# Patient Record
Sex: Male | Born: 1990
Health system: Southern US, Community
[De-identification: ages and names within clinical notes are randomized; demographics above are authoritative.]

---

## 2019-12-24 ENCOUNTER — Emergency Department (HOSPITAL_COMMUNITY): Payer: Self-pay

## 2019-12-24 ENCOUNTER — Inpatient Hospital Stay (HOSPITAL_COMMUNITY)
Admission: RE | Admit: 2019-12-24 | Discharge: 2019-12-27 | DRG: 208 | Disposition: A | Discharge status: Home / Self Care | Payer: Self-pay | Attending: Internal Medicine | Admitting: Internal Medicine

## 2019-12-24 DIAGNOSIS — J939 Pneumothorax, unspecified: Secondary | ICD-10-CM | POA: Diagnosis present

## 2019-12-24 DIAGNOSIS — Z86718 Personal history of other venous thrombosis and embolism: Secondary | ICD-10-CM

## 2019-12-24 DIAGNOSIS — R4182 Altered mental status, unspecified: Secondary | ICD-10-CM

## 2019-12-24 DIAGNOSIS — Z20822 Contact with and (suspected) exposure to covid-19: Secondary | ICD-10-CM | POA: Diagnosis present

## 2019-12-24 DIAGNOSIS — J9690 Respiratory failure, unspecified, unspecified whether with hypoxia or hypercapnia: Secondary | ICD-10-CM

## 2019-12-24 DIAGNOSIS — F10929 Alcohol use, unspecified with intoxication, unspecified: Secondary | ICD-10-CM

## 2019-12-24 DIAGNOSIS — Z86711 Personal history of pulmonary embolism: Secondary | ICD-10-CM

## 2019-12-24 DIAGNOSIS — Y907 Blood alcohol level of 200-239 mg/100 ml: Secondary | ICD-10-CM | POA: Diagnosis present

## 2019-12-24 DIAGNOSIS — J690 Pneumonitis due to inhalation of food and vomit: Principal | ICD-10-CM | POA: Diagnosis present

## 2019-12-24 DIAGNOSIS — F10129 Alcohol abuse with intoxication, unspecified: Secondary | ICD-10-CM | POA: Diagnosis present

## 2019-12-24 DIAGNOSIS — G9341 Metabolic encephalopathy: Secondary | ICD-10-CM | POA: Diagnosis present

## 2019-12-24 DIAGNOSIS — F10921 Alcohol use, unspecified with intoxication delirium: Secondary | ICD-10-CM

## 2019-12-24 DIAGNOSIS — T17500A Unspecified foreign body in bronchus causing asphyxiation, initial encounter: Secondary | ICD-10-CM

## 2019-12-24 DIAGNOSIS — E781 Pure hyperglyceridemia: Secondary | ICD-10-CM | POA: Diagnosis present

## 2019-12-24 DIAGNOSIS — Z7901 Long term (current) use of anticoagulants: Secondary | ICD-10-CM

## 2019-12-24 DIAGNOSIS — J9601 Acute respiratory failure with hypoxia: Secondary | ICD-10-CM | POA: Diagnosis present

## 2019-12-24 LAB — COMPREHENSIVE METABOLIC PANEL
ALT: 13 U/L (ref 0–44)
AST: 19 U/L (ref 15–41)
Albumin: 3.8 g/dL (ref 3.5–5.0)
Alkaline Phosphatase: 54 U/L (ref 38–126)
Anion gap: 9 (ref 5–15)
BUN: 11 mg/dL (ref 6–20)
CO2: 25 mmol/L (ref 22–32)
Calcium: 8.4 mg/dL — ABNORMAL LOW (ref 8.9–10.3)
Chloride: 108 mmol/L (ref 98–111)
Creatinine, Ser: 0.98 mg/dL (ref 0.61–1.24)
GFR, Estimated: >60 mL/min (ref >60)
Glucose, Bld: 120 mg/dL — ABNORMAL HIGH (ref 70–99)
Potassium: 4 mmol/L (ref 3.5–5.1)
Sodium: 142 mmol/L (ref 135–145)
Total Bilirubin: 0.9 mg/dL (ref 0.3–1.2)
Total Protein: 6.5 g/dL (ref 6.5–8.1)

## 2019-12-24 LAB — CBC WITH DIFFERENTIAL/PLATELET
Abs Immature Granulocytes: 0.11 10*3/uL — ABNORMAL HIGH (ref 0.00–0.07)
Basophils Absolute: 0.1 10*3/uL (ref 0.0–0.1)
Basophils Relative: 1 %
Eosinophils Absolute: 0.1 10*3/uL (ref 0.0–0.5)
Eosinophils Relative: 2 %
HCT: 46.6 % (ref 39.0–52.0)
Hemoglobin: 14.7 g/dL (ref 13.0–17.0)
Immature Granulocytes: 2 %
Lymphocytes Relative: 41 %
Lymphs Abs: 2.7 10*3/uL (ref 0.7–4.0)
MCH: 27.5 pg (ref 26.0–34.0)
MCHC: 31.5 g/dL (ref 30.0–36.0)
MCV: 87.3 fL (ref 80.0–100.0)
Monocytes Absolute: 0.5 10*3/uL (ref 0.1–1.0)
Monocytes Relative: 8 %
Neutro Abs: 3.1 10*3/uL (ref 1.7–7.7)
Neutrophils Relative %: 46 %
Platelets: 247 10*3/uL (ref 150–400)
RBC: 5.34 MIL/uL (ref 4.22–5.81)
RDW: 13.2 % (ref 11.5–15.5)
WBC: 6.7 10*3/uL (ref 4.0–10.5)
nRBC: 0 % (ref 0.0–0.2)

## 2019-12-24 LAB — ETHANOL: Alcohol, Ethyl (B): 204 mg/dL — ABNORMAL HIGH (ref <10)

## 2019-12-24 MED ORDER — ORAL CARE MOUTH RINSE
15.0000 mL | OROMUCOSAL | Status: DC
  Administered 2019-12-25 – 2019-12-27 (×17): 15 mL via OROMUCOSAL

## 2019-12-24 MED ORDER — VITAL HIGH PROTEIN PO LIQD
1000.0000 mL | ORAL | Status: AC
  Administered 2019-12-25: 1000 mL

## 2019-12-24 MED ORDER — FAMOTIDINE IN NACL 20-0.9 MG/50ML-% IV SOLN
20.0000 mg | Freq: Two times a day (BID) | INTRAVENOUS | Status: DC
  Administered 2019-12-25 (×2): 20 mg via INTRAVENOUS
  Filled 2019-12-24 (×2): qty 50

## 2019-12-24 MED ORDER — ONDANSETRON HCL 4 MG/2ML IJ SOLN
4.0000 mg | Freq: Four times a day (QID) | INTRAMUSCULAR | Status: DC | PRN
  Administered 2019-12-26 – 2019-12-27 (×2): 4 mg via INTRAVENOUS
  Filled 2019-12-24 (×2): qty 2

## 2019-12-24 MED ORDER — PROSOURCE TF PO LIQD
45.0000 mL | Freq: Two times a day (BID) | ORAL | Status: DC
  Administered 2019-12-25 (×2): 45 mL
  Filled 2019-12-24 (×2): qty 45

## 2019-12-24 MED ORDER — ROCURONIUM BROMIDE 50 MG/5ML IV SOLN
INTRAVENOUS | Status: AC | PRN
  Administered 2019-12-24: 80 mg via INTRAVENOUS

## 2019-12-24 MED ORDER — PROPOFOL 1000 MG/100ML IV EMUL
0.0000 ug/kg/min | INTRAVENOUS | Status: DC
  Administered 2019-12-24: 35 ug/kg/min via INTRAVENOUS
  Administered 2019-12-25: 30 ug/kg/min via INTRAVENOUS
  Administered 2019-12-25: 50 ug/kg/min via INTRAVENOUS
  Administered 2019-12-25: 40 ug/kg/min via INTRAVENOUS
  Administered 2019-12-25 (×3): 50 ug/kg/min via INTRAVENOUS
  Administered 2019-12-26: 10 ug/kg/min via INTRAVENOUS
  Administered 2019-12-26: 50 ug/kg/min via INTRAVENOUS
  Filled 2019-12-24 (×5): qty 100
  Filled 2019-12-24: qty 200
  Filled 2019-12-24 (×2): qty 100

## 2019-12-24 MED ORDER — SODIUM CHLORIDE 0.9 % IV SOLN
2.0000 g | Freq: Once | INTRAVENOUS | Status: AC
  Administered 2019-12-25: 2 g via INTRAVENOUS
  Filled 2019-12-24: qty 20

## 2019-12-24 MED ORDER — ROCURONIUM BROMIDE 50 MG/5ML IV SOLN
INTRAVENOUS | Status: AC | PRN
  Administered 2019-12-24: 100 mg via INTRAVENOUS

## 2019-12-24 MED ORDER — ENOXAPARIN SODIUM 40 MG/0.4ML ~~LOC~~ SOLN
40.0000 mg | SUBCUTANEOUS | Status: DC
  Administered 2019-12-25: 40 mg via SUBCUTANEOUS
  Filled 2019-12-24: qty 0.4

## 2019-12-24 MED ORDER — DOCUSATE SODIUM 100 MG PO CAPS: 100.0000 mg | ORAL_CAPSULE | Freq: Two times a day (BID) | ORAL | Status: DC | PRN

## 2019-12-24 MED ORDER — NALOXONE HCL 2 MG/2ML IJ SOSY
PREFILLED_SYRINGE | INTRAMUSCULAR | Status: AC
  Filled 2019-12-24: qty 2

## 2019-12-24 MED ORDER — POLYETHYLENE GLYCOL 3350 17 G PO PACK: 17.0000 g | PACK | Freq: Every day | ORAL | Status: DC | PRN

## 2019-12-24 MED ORDER — FENTANYL CITRATE (PF) 100 MCG/2ML IJ SOLN: 50.0000 ug | Freq: Once | INTRAMUSCULAR | Status: DC

## 2019-12-24 MED ORDER — SODIUM CHLORIDE 0.9 % IV SOLN: 2.0000 g | INTRAVENOUS | Status: DC

## 2019-12-24 MED ORDER — PROPOFOL 1000 MG/100ML IV EMUL
INTRAVENOUS | Status: AC
  Administered 2019-12-24: 10 ug/kg/min
  Filled 2019-12-24: qty 100

## 2019-12-24 MED ORDER — POLYETHYLENE GLYCOL 3350 17 G PO PACK
17.0000 g | PACK | Freq: Every day | ORAL | Status: DC
  Administered 2019-12-25: 17 g via ORAL
  Filled 2019-12-24: qty 1

## 2019-12-24 MED ORDER — FENTANYL BOLUS VIA INFUSION
50.0000 ug | INTRAVENOUS | Status: DC | PRN
  Administered 2019-12-25 (×2): 50 ug via INTRAVENOUS
  Filled 2019-12-24: qty 50

## 2019-12-24 MED ORDER — SODIUM CHLORIDE 0.9 % IV SOLN
500.0000 mg | INTRAVENOUS | Status: DC
  Administered 2019-12-25: 500 mg via INTRAVENOUS
  Filled 2019-12-24: qty 500

## 2019-12-24 MED ORDER — DOCUSATE SODIUM 50 MG/5ML PO LIQD
100.0000 mg | Freq: Two times a day (BID) | ORAL | Status: DC
  Administered 2019-12-25: 100 mg via ORAL
  Filled 2019-12-24: qty 10

## 2019-12-24 MED ORDER — ETOMIDATE 2 MG/ML IV SOLN
INTRAVENOUS | Status: AC | PRN
  Administered 2019-12-24: 20 mg via INTRAVENOUS

## 2019-12-24 MED ORDER — MIDAZOLAM HCL 2 MG/2ML IJ SOLN: 2.0000 mg | INTRAMUSCULAR | Status: DC | PRN

## 2019-12-24 MED ORDER — CHLORHEXIDINE GLUCONATE 0.12% ORAL RINSE (MEDLINE KIT)
15.0000 mL | Freq: Two times a day (BID) | OROMUCOSAL | Status: DC
  Administered 2019-12-25 – 2019-12-26 (×4): 15 mL via OROMUCOSAL

## 2019-12-24 MED ORDER — PROPOFOL 10 MG/ML IV BOLUS
INTRAVENOUS | Status: AC | PRN
  Administered 2019-12-24: 100 mg via INTRAVENOUS

## 2019-12-24 MED ORDER — FENTANYL 2500MCG IN NS 250ML (10MCG/ML) PREMIX INFUSION
50.0000 ug/h | INTRAVENOUS | Status: DC
  Administered 2019-12-25: 200 ug/h via INTRAVENOUS
  Administered 2019-12-25: 50 ug/h via INTRAVENOUS
  Filled 2019-12-24 (×2): qty 250

## 2019-12-24 MED ORDER — ALBUTEROL SULFATE (2.5 MG/3ML) 0.083% IN NEBU: 2.5000 mg | INHALATION_SOLUTION | RESPIRATORY_TRACT | Status: DC | PRN

## 2019-12-24 MED ORDER — SODIUM CHLORIDE 0.9 % IV BOLUS
1000.0000 mL | Freq: Once | INTRAVENOUS | Status: AC
  Administered 2019-12-24: 1000 mL via INTRAVENOUS

## 2019-12-24 NOTE — ED Notes (Addendum)
Wo Baza, roommate, [phone]. Call for updates.

## 2019-12-24 NOTE — ED Notes (Signed)
4 mg IV zofran given

## 2019-12-24 NOTE — Progress Notes (Signed)
RT note.  ETT advance to 25 at lips per Dr. Renaye Rakers. RT transported to CT and back without any complications at this time.

## 2019-12-24 NOTE — ED Notes (Addendum)
Pt self extubated at this time. Provider at bedside bagging pt

## 2019-12-24 NOTE — ED Triage Notes (Signed)
Pt BIB [facility], found outside at a party, initially only responsive to pain. +ETOH, unknown drug use. Pt vomited x 1 on arrival.

## 2019-12-24 NOTE — H&P (Signed)
NAME:  Seth Burnett, MRN:  [id number], DOB:  11-16-1990, LOS: 0 ADMISSION DATE:  12/24/2019, CONSULTATION DATE: December 24, 2019 REFERRING MD: Dr. Renaye Rakers, CHIEF COMPLAINT:  Found down  Brief History   29 year old male brought in by EMS after being found down, was reported to have been drinking heavily throughout the day. Intubated in the emergency room.3  History of present illness   29 year old male with no known past medical history was brought in by EMS after being found down in the setting of heavy alcohol use on December 24, 2019. In the emergency room the patient was noted to be profoundly hypoxemic and obtunded requiring intubation. After intubation he self extubated but remained obtunded and profoundly hypoxemic. He was promptly reintubated. Pulmonary and critical care medicine is consulted for further evaluation. There are no family present to give history, the only reported history was that he had been drinking heavily all day. The patient was intubated on my evaluation and cannot provide history.  Past Medical History  Unknown  Significant Hospital Events   October 30 admission  Consults:  None  Procedures:  October 30 endotracheal tube    Significant Diagnostic Tests:  October 30 CT head unremarkable October 30 CT chest images independently reviewed showing debris in the bronchus intermedius, mucus plugging and consolidation in the right lower lobe  Micro Data:  October 30 SARS-CoV-2/flu pending  Antimicrobials:  October 30 ceftriaxone October 30 azithromycin  Interim history/subjective:    Objective   Blood pressure 129/90, pulse 74, temperature (!) 97.4 F (36.3 C), temperature source Temporal, resp. rate 15, height (S) 6\' 3"  (1.905 m), weight 107.8 kg, SpO2 100 %.    Vent Mode: PRVC FiO2 (%):  [100 %] 100 % Set Rate:  [15 bmp] 15 bmp Vt Set:  [500 mL-670 mL] 670 mL PEEP:  [5 cmH20] 5 cmH20   Intake/Output Summary (Last 24 hours) at 12/24/2019  2327 Last data filed at 12/24/2019 2326 Gross per 24 hour  Intake 1020 ml  Output --  Net 1020 ml   Filed Weights   12/24/19 2151  Weight: 107.8 kg    Examination: General: Moaning, nonpurposeful movements, respiratory distress HENT: NCAT ETT in place pupils equal round reactive to light, episcleritis/redness noted PULM: Rhonchi bilaterally B, vent supported breathing CV: RRR, no mgr GI: BS+, soft, nontender MSK: normal bulk and tone Neuro: Nonpurposeful movements, grunts, nonverbal, no eye-opening  Chest x-ray images independently reviewed showing ET tube in place, right lower lobe consolidation  Resolved Hospital Problem list     Assessment & Plan:  Acute respiratory failure with hypoxemia in the setting of inability to protect airway Aspiration pneumonia Admit to ICU Full mechanical ventilatory support Daily spontaneous breathing trial and wake-up assessment Ventilator associated pneumonia prevention protocol Ceftriaxone Azithromycin  Acute metabolic encephalopathy: Presumably due to alcohol intoxication, question whether or not there are other substances involve; he is waking up but remains a risk to himself as he self extubated and became immediately profoundly hypoxemic Urine drug screen Needs sedation to be maintained on full mechanical ventilatory support: PAD protocol Propofol infusion Fentanyl infusion As needed Versed injection RASS goal -2  Best practice:  Diet: Tube feeding Pain/Anxiety/Delirium protocol (if indicated): As above VAP protocol (if indicated): Yes DVT prophylaxis: Lovenox GI prophylaxis: Famotidine Glucose control: Monitor Mobility: Bedrest Code Status: Full Family Communication: None present Disposition:   Labs   CBC: Recent Labs  Lab 12/24/19 2116  WBC 6.7  NEUTROABS 3.1  HGB 14.7  HCT  46.6  MCV 87.3  PLT 247    Basic Metabolic Panel: Recent Labs  Lab 12/24/19 2116  NA 142  K 4.0  CL 108  CO2 25  GLUCOSE 120*   BUN 11  CREATININE 0.98  CALCIUM 8.4*   GFR: Estimated Creatinine Clearance: 146.2 mL/min (by C-G formula based on SCr of 0.98 mg/dL). Recent Labs  Lab 12/24/19 2116  WBC 6.7    Liver Function Tests: Recent Labs  Lab 12/24/19 2116  AST 19  ALT 13  ALKPHOS 54  BILITOT 0.9  PROT 6.5  ALBUMIN 3.8   No results for input(s): LIPASE, AMYLASE in the last 168 hours. No results for input(s): AMMONIA in the last 168 hours.  ABG No results found for: PHART, PCO2ART, PO2ART, HCO3, TCO2, ACIDBASEDEF, O2SAT   Coagulation Profile: No results for input(s): INR, PROTIME in the last 168 hours.  Cardiac Enzymes: No results for input(s): CKTOTAL, CKMB, CKMBINDEX, TROPONINI in the last 168 hours.  HbA1C: No results found for: HGBA1C  CBG: No results for input(s): GLUCAP in the last 168 hours.  Review of Systems:   Cannot obtain due to altered mental status/intubation  Past Medical/Surgical/Family  History  Cannot obtain due to intubation  Allergies Not on File   Home Medications  Prior to Admission medications   Not on File     Critical care time: 33 minutes    Heber [place], MD [facility] Pager: [phone] Cell: [phone] If no response, call [phone]

## 2019-12-24 NOTE — ED Provider Notes (Signed)
[facility] EMERGENCY DEPARTMENT Provider Note   CSN: [id number] Arrival date & time: 12/24/19  2059     History Chief Complaint  Patient presents with  . Altered Mental Status    Seth Burnett is a 29 y.o. male w/ unknown PMHx presenting with AMS.  EMS called to the scene by bystanders as patient found unresponsive in a public setting.  Patient is obtunded on arrival, cannot provide history.  EMS reports patient found "covered in vomit" and vomited once en route to the hospital, and then again on arrival.   No immediate family members available or family contacts available  Supplemental history was later provided by 2 friends in the hospital, who report the patient was with them "drinking all day, shots after shots of liquor."  It was unclear if he took any other drugs, but they "don't think so." They deny any kind of altercation or head trauma that they witnessed.  They are not aware of his full medical history.  I asked them repeatedly to help [place] contact family members, and they said they would try to help look them up, but did not have information immediately available.  HPI     No past medical history on file.  Patient Active Problem List   Diagnosis Date Noted  . Alcohol intoxication ([place]) 12/24/2019    No family history on file.  Social History   Tobacco Use  . Smoking status: Not on file  Substance Use Topics  . Alcohol use: Not on file  . Drug use: Not on file    Home Medications Prior to Admission medications   Not on File    Allergies    Patient has no allergy information on record.  Review of Systems   Review of Systems  Unable to perform ROS: Patient unresponsive (level 5 caveat)    Physical Exam Updated Vital Signs BP 102/70 (BP Location: Right Arm)   Pulse 78   Temp (!) 97.4 F (36.3 C) (Temporal)   Resp 20   Ht (S) _0  (1.905 m)   Wt 107.8 kg   SpO2 95%   BMI 29.70 kg/m   Physical Exam Vitals and nursing  note reviewed.  Constitutional:      Appearance: He is well-developed.     Comments: Obtunded, vomit in mouth and chin  HENT:     Head: Normocephalic and atraumatic.  Eyes:     Pupils: Pupils are equal, round, and reactive to light.     Comments: Sclera injected bilaterally  Cardiovascular:     Rate and Rhythm: Normal rate and regular rhythm.     Pulses: Normal pulses.  Pulmonary:     Effort: Pulmonary effort is normal. No respiratory distress.     Comments: Coarse breath sounds bilaterally, diminished in lung bases 90% on room air Abdominal:     General: There is no distension.     Palpations: Abdomen is soft.     Tenderness: There is no abdominal tenderness.  Musculoskeletal:     Cervical back: Neck supple.  Skin:    General: Skin is warm and dry.  Neurological:     Mental Status: He is unresponsive.     GCS: GCS eye subscore is 1. GCS verbal subscore is 1. GCS motor subscore is 1.     Comments: Gag reflex and blink reflex present     ED Results / Procedures / Treatments   Labs (all labs ordered are listed, but only abnormal results  are displayed) Labs Reviewed  COMPREHENSIVE METABOLIC PANEL - Abnormal; Notable for the following components:      Result Value   Glucose, Bld 120 (*)    Calcium 8.4 (*)    All other components within normal limits  CBC WITH DIFFERENTIAL/PLATELET - Abnormal; Notable for the following components:   Abs Immature Granulocytes 0.11 (*)    All other components within normal limits  ETHANOL - Abnormal; Notable for the following components:   Alcohol, Ethyl (B) 204 (*)    All other components within normal limits  I-STAT ARTERIAL BLOOD GAS, ED - Abnormal; Notable for the following components:   pO2, Arterial 49 (*)    All other components within normal limits  RESPIRATORY PANEL BY RT PCR (FLU A&B, COVID)  CULTURE, BLOOD (ROUTINE X 2)  CULTURE, BLOOD (ROUTINE X 2)  BLOOD GAS, VENOUS  URINALYSIS, ROUTINE W REFLEX MICROSCOPIC  RAPID URINE  DRUG SCREEN, HOSP PERFORMED  BLOOD GAS, ARTERIAL  HIV ANTIBODY (ROUTINE TESTING W REFLEX)  MAGNESIUM  MAGNESIUM  PHOSPHORUS  PHOSPHORUS  CBC  COMPREHENSIVE METABOLIC PANEL  CBC WITH DIFFERENTIAL/PLATELET  URINALYSIS, ROUTINE W REFLEX MICROSCOPIC  CBC  BASIC METABOLIC PANEL  BLOOD GAS, ARTERIAL  TRIGLYCERIDES  MAGNESIUM  PHOSPHORUS    EKG None  Radiology CT Head Wo Contrast  Result Date: 12/24/2019 CLINICAL DATA:  Mental status changes EXAM: CT HEAD WITHOUT CONTRAST TECHNIQUE: Contiguous axial images were obtained from the base of the skull through the vertex without intravenous contrast. COMPARISON:  None. FINDINGS: Brain: No acute intracranial abnormality. Specifically, no hemorrhage, hydrocephalus, mass lesion, acute infarction, or significant intracranial injury. Vascular: No hyperdense vessel or unexpected calcification. Skull: No acute calvarial abnormality. Sinuses/Orbits: Mucosal thickening within the ethmoid air cells. No air-fluid levels. Other: None IMPRESSION: No intracranial abnormality. Electronically Signed   By: Rolm Baptise M.D.   On: 12/24/2019 22:50   CT Chest Wo Contrast  Result Date: 12/24/2019 CLINICAL DATA:  Altered mental status, found down. EXAM: CT CHEST WITHOUT CONTRAST TECHNIQUE: Multidetector CT imaging of the chest was performed following the standard protocol without IV contrast. COMPARISON:  None. FINDINGS: Cardiovascular: No significant vascular findings. Normal heart size. A trace amount of pericardial fluid is seen. Mediastinum/Nodes: There is mild left-sided supraclavicular lymphadenopathy. No enlarged mediastinal or axillary lymph nodes. Thyroid gland, trachea, and esophagus demonstrate no significant findings. Lungs/Pleura: Endotracheal and nasogastric tubes are seen (see below). Moderate severity consolidation is seen within the posterior aspect of the bilateral lower lobes. There is no evidence of a pleural effusion or pneumothorax. Upper Abdomen:  A nasogastric tube seen with its distal portion extending along the anterior and lateral aspects of the left upper quadrant. This is adjacent to the body of the stomach the, however this appears to extend outside of the gastric lumen. The distal tip of the nasogastric tube is seen within the posterior aspect of the left upper quadrant. It should be noted that free air is not clearly identified within the left upper quadrant. Musculoskeletal: No chest wall mass or suspicious bone lesions identified. IMPRESSION: 1. Moderate severity bilateral lower lobe consolidation. 2. Mild left-sided supraclavicular lymphadenopathy. 3. Nasogastric tube positioning as described above. The distal portion of the NG tube may be within the lumen of a decompressed stomach, however, given its appearance on the current exam, extraluminal location cannot be excluded. Further evaluation with an abdominal plain film, following injection of water-soluble radiopaque contrast into the nasogastric tube lumen, is recommended. Electronically Signed   By: Virgina Norfolk  M.D.   On: 12/24/2019 23:12   DG Chest Portable 1 View  Result Date: 12/24/2019 CLINICAL DATA:  Post intubation EXAM: PORTABLE CHEST 1 VIEW COMPARISON:  12/24/2019 FINDINGS: endotracheal tube is 2.3 cm above the carina. Complete opacification of the right hemithorax, new since prior study. Left lung clear. Heart is normal size. NG tube is in the stomach. IMPRESSION: Endotracheal tube 2 cm above the carina. Complete opacification of the right hemithorax Dom presumably volume loss/atelectasis with shift of mediastinal contours to the right. Electronically Signed   By: Rolm Baptise M.D.   On: 12/24/2019 23:42   DG Chest Portable 1 View  Result Date: 12/24/2019 CLINICAL DATA:  Altered mental status, found down. EXAM: PORTABLE CHEST 1 VIEW COMPARISON:  None. FINDINGS: An endotracheal tube is seen with its distal tip approximately 9.9 cm from the carina. A nasogastric tube is  seen with its distal end looped within the body of the stomach. The heart size and mediastinal contours are within normal limits. Both lungs are clear. The visualized skeletal structures are unremarkable. IMPRESSION: 1. Endotracheal tube and nasogastric tube positioning, as described above. 2. No acute or active cardiopulmonary disease. Electronically Signed   By: Virgina Norfolk M.D.   On: 12/24/2019 21:56    Procedures Procedure Name: Intubation Date/Time: 12/25/2019 12:19 AM Performed by: Wyvonnia Dusky, MD Pre-anesthesia Checklist: Patient identified, Patient being monitored, Emergency Drugs available, Timeout performed and Suction available Oxygen Delivery Method: Non-rebreather mask Preoxygenation: Pre-oxygenation with 100% oxygen Induction Type: Rapid sequence Ventilation: Mask ventilation without difficulty Laryngoscope Size: Mac and 4 Tube size: 7.5 mm Number of attempts: 2 Airway Equipment and Method: Bougie stylet Placement Confirmation: ETT inserted through vocal cords under direct vision,  CO2 detector and Breath sounds checked- equal and bilateral Secured at: 23 cm Tube secured with: ETT holder Dental Injury: Bloody posterior oropharynx  Difficulty Due To: Difficult Airway- due to anterior larynx Comments: Difficult airway with anterior larynx and secretions, requiring suctioning.  After initial failed attempt with glidescope, successful intubation with bougie and 7.5 ETT.  No desaturations below 90% during intubation attempts.    .Critical Care Performed by: Wyvonnia Dusky, MD Authorized by: Wyvonnia Dusky, MD   Critical care provider statement:    Critical care time (minutes):  45   Critical care was necessary to treat or prevent imminent or life-threatening deterioration of the following conditions:  Respiratory failure   Critical care was time spent personally by me on the following activities:  Discussions with consultants, evaluation of patient's  response to treatment, examination of patient, ordering and performing treatments and interventions, ordering and review of laboratory studies, ordering and review of radiographic studies, pulse oximetry, re-evaluation of patient's condition, obtaining history from patient or surrogate and review of old charts   (including critical care time)  Medications Ordered in ED Medications  docusate sodium (COLACE) capsule 100 mg (has no administration in time range)  polyethylene glycol (MIRALAX / GLYCOLAX) packet 17 g (has no administration in time range)  docusate (COLACE) 50 MG/5ML liquid 100 mg (has no administration in time range)  polyethylene glycol (MIRALAX / GLYCOLAX) packet 17 g (has no administration in time range)  feeding supplement (VITAL HIGH PROTEIN) liquid 1,000 mL (has no administration in time range)  feeding supplement (PROSource TF) liquid 45 mL (has no administration in time range)  enoxaparin (LOVENOX) injection 40 mg (40 mg Subcutaneous Given 12/25/19 0009)  ondansetron (ZOFRAN) injection 4 mg (has no administration in time range)  famotidine (  PEPCID) IVPB 20 mg premix (has no administration in time range)  albuterol (PROVENTIL) (2.5 MG/3ML) 0.083% nebulizer solution 2.5 mg (has no administration in time range)  chlorhexidine gluconate (MEDLINE KIT) (PERIDEX) 0.12 % solution 15 mL (has no administration in time range)  MEDLINE mouth rinse (has no administration in time range)  fentaNYL (SUBLIMAZE) injection 50 mcg (has no administration in time range)  fentaNYL 252mg in NS 2566m(1046mml) infusion-PREMIX (has no administration in time range)  fentaNYL (SUBLIMAZE) bolus via infusion 50 mcg (has no administration in time range)  propofol (DIPRIVAN) 1000 MG/100ML infusion (35 mcg/kg/min  107.8 kg Intravenous New Bag/Given 12/24/19 2328)  midazolam (VERSED) injection 2 mg (has no administration in time range)  midazolam (VERSED) injection 2 mg (has no administration in time  range)  cefTRIAXone (ROCEPHIN) 2 g in sodium chloride 0.9 % 100 mL IVPB (has no administration in time range)  azithromycin (ZITHROMAX) 500 mg in sodium chloride 0.9 % 250 mL IVPB (has no administration in time range)  cefTRIAXone (ROCEPHIN) 2 g in sodium chloride 0.9 % 100 mL IVPB (2 g Intravenous New Bag/Given 12/25/19 0009)  naloxone (NARCAN) 2 MG/2ML injection (  Given 12/24/19 2113)  sodium chloride 0.9 % bolus 1,000 mL (0 mLs Intravenous Stopped 12/24/19 2326)  etomidate (AMIDATE) injection (20 mg Intravenous Given 12/24/19 2132)  rocuronium (ZEMURON) injection (80 mg Intravenous Given 12/24/19 2132)  etomidate (AMIDATE) injection (20 mg Intravenous Given 12/24/19 2304)  rocuronium (ZEMURON) injection (100 mg Intravenous Given 12/24/19 2305)  propofol (DIPRIVAN) 10 mg/mL bolus/IV push (100 mg Intravenous Given 12/24/19 2310)    ED Course  I have reviewed the triage vital signs and the nursing notes.  Pertinent labs & imaging results that were available during my care of the patient were reviewed by me and considered in my medical decision making (see chart for details).  This is a [age]ar old male with unknown medical history presenting to the ED obtunded by EMS.  Friends on scene reported he had been drinking large amounts of liquor all day.  We do not otherwise have a reliable history, and no family contacts on his arrival.  He was given narcan after arrival with no response.  His BS was wnl.    The decision was made to intubate for airway protection given his recurrent vomiting episodes, concern for aspiration, and his diminished GCS on arrival.  He was successfully intubated with rocuronium and etomidate and a 7.5 ETT.  Propofol was ordered for sedation.  [facility], with no acute ICH, and bilateral pleural consolidations - likely aspiration noted.  See ED course below regarding CT results.   After CT imaging, the patient aroused enough to able to  pull free of his restraints and self-extubated.  He was re-intubated by the ICU team, as per the ED course below.    I personally reviewed his labs and imaging.  Labs showed unremarkable CMP and CBC.  Etoh level 204 - no significant elevation in keeping with his level of obtundation.    EKG showed NSR on arrival, mild diffuse ST elevations consistent with BEL, no reciprocal changes to suggest ACS.  I do not believe this is a STEMI.  I am unable to amend the read or sign the ECG in our system as it is assigned to a [place]     Clinical Course as of Dec 25 31  Sat Dec 24, 2019  2126 No response to  narcan.  Hx from friends is suggestive of significant alcohol poisoning.  GCS remains 3, unresponsive to sternal rub.  Preparing for intubation   [MT]  2157 Intubated with 7.5 ETT    [MT]  2157 ICU consulted   [MT]  2204 FINDINGS: An endotracheal tube is seen with its distal tip approximately 9.9 cm from the carina. A nasogastric tube is seen with its distal end looped within the body of the stomach. The heart size and mediastinal contours are within normal limits. Both lungs are clear. The visualized skeletal structures are unremarkable.     [MT]  2245 ICU consulted.  Etoh level 204 does not likely correlate with the degree of obtundation/intoxication seen here   [MT]  2357 I was called back into the room by nursing staff at approx 11 pm as the patient had self-extubated himself.  On my assessment, he was moving his extremities but not following commands or orders.  I quickly confirmed with glidescope that the ETT tube had been dislodged from his trachea and then I removed the ETT and OG-tube.  We bagged the patient with improvement of his oxygenation level from 60% to 80%.  At this time Dr Pennie Banter from the ICU team presented himself, and the decision was made to re-intubate the patient, as we both did not feel that he was lucid enough for a full trial of extubation at this time, and had  concerning persistent hypoxia.  This re-intubation was performed by Dr Pennie Banter with etomidate and rocuronium with a first-pass trial.  Medical management of the patient was subsequently assumed by the ICU team.   [MT]  Sun Dec 25, 2019  0000 I personally reviewed the patient's imaging.  CTH after initial intubation showed no ICH or clear cause for AMS.  CT chest showed marked bilateral consolidations consistent with significant aspiration PNA, likely from vomiting.  I spoke to the radiologist regarding OG tube placement seen on CT chest. I suspect the OG tube was likely placed in a decompressed stomach, as we did have aspiration of stomach contents from the tube, and I did not see signs of free air under the diaphragm on CT or subsequent xray films to suggest a perforation of the bowel.  Regardless, the original OG tube was removed by myself after the patient had self-extubated.  The ICU team was made aware of the CT chest findings.   [MT]  0009 Repeat xray confirming 2nd ETT placement above the carina and noting now significant collpase or atelectasis of the right lung.  Dr Pennie Banter made aware.  Pt stable on the vent at the time of admission.  No immediate family information available.   [MT]    Clinical Course User Index [MT] Emil Weigold, Carola Rhine, MD    Final Clinical Impression(s) / ED Diagnoses Final diagnoses:  Altered mental status, unspecified altered mental status type  Acute alcoholic intoxication with complication ([place])  Aspiration pneumonia of both lower lobes due to vomit [facility])    Rx / DC Orders ED Discharge Orders    None       Zorian Gunderman, Carola Rhine, MD 12/25/19 [phone]

## 2019-12-25 ENCOUNTER — Inpatient Hospital Stay (HOSPITAL_COMMUNITY): Payer: Self-pay

## 2019-12-25 DIAGNOSIS — J9601 Acute respiratory failure with hypoxia: Secondary | ICD-10-CM

## 2019-12-25 DIAGNOSIS — J690 Pneumonitis due to inhalation of food and vomit: Principal | ICD-10-CM

## 2019-12-25 LAB — MAGNESIUM: Magnesium: 2.3 mg/dL (ref 1.7–2.4)

## 2019-12-25 LAB — CBC WITH DIFFERENTIAL/PLATELET
Abs Immature Granulocytes: 0.06 10*3/uL (ref 0.00–0.07)
Basophils Absolute: 0 10*3/uL (ref 0.0–0.1)
Basophils Relative: 0 %
Eosinophils Absolute: 0 10*3/uL (ref 0.0–0.5)
Eosinophils Relative: 0 %
HCT: 55.4 % — ABNORMAL HIGH (ref 39.0–52.0)
Hemoglobin: 17.2 g/dL — ABNORMAL HIGH (ref 13.0–17.0)
Immature Granulocytes: 1 %
Lymphocytes Relative: 28 %
Lymphs Abs: 2.1 10*3/uL (ref 0.7–4.0)
MCH: 27 pg (ref 26.0–34.0)
MCHC: 31 g/dL (ref 30.0–36.0)
MCV: 87.1 fL (ref 80.0–100.0)
Monocytes Absolute: 0.4 10*3/uL (ref 0.1–1.0)
Monocytes Relative: 6 %
Neutro Abs: 4.9 10*3/uL (ref 1.7–7.7)
Neutrophils Relative %: 65 %
Platelets: 281 10*3/uL (ref 150–400)
RBC: 6.36 MIL/uL — ABNORMAL HIGH (ref 4.22–5.81)
RDW: 13.2 % (ref 11.5–15.5)
WBC: 7.5 10*3/uL (ref 4.0–10.5)
nRBC: 0 % (ref 0.0–0.2)

## 2019-12-25 LAB — I-STAT ARTERIAL BLOOD GAS, ED
Acid-base deficit: 1 mmol/L (ref 0.0–2.0)
Bicarbonate: 25.3 mmol/L (ref 20.0–28.0)
Calcium, Ion: 1.18 mmol/L (ref 1.15–1.40)
HCT: 48 % (ref 39.0–52.0)
Hemoglobin: 16.3 g/dL (ref 13.0–17.0)
O2 Saturation: 84 %
Patient temperature: 97.4
Potassium: 4.7 mmol/L (ref 3.5–5.1)
Sodium: 142 mmol/L (ref 135–145)
TCO2: 27 mmol/L (ref 22–32)
pCO2 arterial: 44.9 mmHg (ref 32.0–48.0)
pH, Arterial: 7.356 (ref 7.350–7.450)
pO2, Arterial: 49 mmHg — ABNORMAL LOW (ref 83.0–108.0)

## 2019-12-25 LAB — POCT I-STAT 7, (LYTES, BLD GAS, ICA,H+H)
Acid-base deficit: 1 mmol/L (ref 0.0–2.0)
Bicarbonate: 24.8 mmol/L (ref 20.0–28.0)
Calcium, Ion: 1.15 mmol/L (ref 1.15–1.40)
HCT: 52 % (ref 39.0–52.0)
Hemoglobin: 17.7 g/dL — ABNORMAL HIGH (ref 13.0–17.0)
O2 Saturation: 100 %
Potassium: 4.3 mmol/L (ref 3.5–5.1)
Sodium: 141 mmol/L (ref 135–145)
TCO2: 26 mmol/L (ref 22–32)
pCO2 arterial: 45.2 mmHg (ref 32.0–48.0)
pH, Arterial: 7.348 — ABNORMAL LOW (ref 7.350–7.450)
pO2, Arterial: 402 mmHg — ABNORMAL HIGH (ref 83.0–108.0)

## 2019-12-25 LAB — URINALYSIS, ROUTINE W REFLEX MICROSCOPIC
Bilirubin Urine: NEGATIVE
Glucose, UA: NEGATIVE mg/dL
Hgb urine dipstick: NEGATIVE
Ketones, ur: NEGATIVE mg/dL
Leukocytes,Ua: NEGATIVE
Nitrite: NEGATIVE
Protein, ur: NEGATIVE mg/dL
Specific Gravity, Urine: 1.017 (ref 1.005–1.030)
pH: 5 (ref 5.0–8.0)

## 2019-12-25 LAB — COMPREHENSIVE METABOLIC PANEL
ALT: 21 U/L (ref 0–44)
AST: 28 U/L (ref 15–41)
Albumin: 4.3 g/dL (ref 3.5–5.0)
Alkaline Phosphatase: 69 U/L (ref 38–126)
Anion gap: 11 (ref 5–15)
BUN: 10 mg/dL (ref 6–20)
CO2: 25 mmol/L (ref 22–32)
Calcium: 8.9 mg/dL (ref 8.9–10.3)
Chloride: 104 mmol/L (ref 98–111)
Creatinine, Ser: 1.17 mg/dL (ref 0.61–1.24)
GFR, Estimated: >60 mL/min (ref >60)
Glucose, Bld: 114 mg/dL — ABNORMAL HIGH (ref 70–99)
Potassium: 5.3 mmol/L — ABNORMAL HIGH (ref 3.5–5.1)
Sodium: 140 mmol/L (ref 135–145)
Total Bilirubin: 1.5 mg/dL — ABNORMAL HIGH (ref 0.3–1.2)
Total Protein: 7.8 g/dL (ref 6.5–8.1)

## 2019-12-25 LAB — RAPID URINE DRUG SCREEN, HOSP PERFORMED
Amphetamines: NOT DETECTED
Barbiturates: NOT DETECTED
Benzodiazepines: NOT DETECTED
Cocaine: NOT DETECTED
Opiates: NOT DETECTED
Tetrahydrocannabinol: POSITIVE — AB

## 2019-12-25 LAB — MRSA PCR SCREENING

## 2019-12-25 LAB — PROTIME-INR
INR: 1.1 (ref 0.8–1.2)
Prothrombin Time: 14 seconds (ref 11.4–15.2)

## 2019-12-25 LAB — HIV ANTIBODY (ROUTINE TESTING W REFLEX): HIV Screen 4th Generation wRfx: NONREACTIVE

## 2019-12-25 LAB — GLUCOSE, CAPILLARY
Glucose-Capillary: 105 mg/dL — ABNORMAL HIGH (ref 70–99)
Glucose-Capillary: 98 mg/dL (ref 70–99)
Glucose-Capillary: 111 mg/dL — ABNORMAL HIGH (ref 70–99)
Glucose-Capillary: 110 mg/dL — ABNORMAL HIGH (ref 70–99)
Glucose-Capillary: 105 mg/dL — ABNORMAL HIGH (ref 70–99)
Glucose-Capillary: 110 mg/dL — ABNORMAL HIGH (ref 70–99)

## 2019-12-25 LAB — APTT: aPTT: 26 seconds (ref 24–36)

## 2019-12-25 LAB — HEPARIN LEVEL (UNFRACTIONATED): Heparin Unfractionated: <0.1 IU/mL — ABNORMAL LOW (ref 0.30–0.70)

## 2019-12-25 LAB — RESPIRATORY PANEL BY RT PCR (FLU A&B, COVID)
Influenza A by PCR: NEGATIVE
Influenza B by PCR: NEGATIVE
SARS Coronavirus 2 by RT PCR: NEGATIVE

## 2019-12-25 LAB — TRIGLYCERIDES: Triglycerides: 143 mg/dL (ref <150)

## 2019-12-25 LAB — PHOSPHORUS: Phosphorus: 2.8 mg/dL (ref 2.5–4.6)

## 2019-12-25 MED ORDER — ENOXAPARIN SODIUM 150 MG/ML ~~LOC~~ SOLN
150.0000 mg | SUBCUTANEOUS | Status: DC
  Administered 2019-12-25 – 2019-12-26 (×2): 150 mg via SUBCUTANEOUS
  Filled 2019-12-25 (×2): qty 1

## 2019-12-25 MED ORDER — CHLORHEXIDINE GLUCONATE 0.12% ORAL RINSE (MEDLINE KIT): 15.0000 mL | Freq: Two times a day (BID) | OROMUCOSAL | Status: DC

## 2019-12-25 MED ORDER — FAMOTIDINE 40 MG/5ML PO SUSR
20.0000 mg | Freq: Two times a day (BID) | ORAL | Status: DC
  Administered 2019-12-25: 20 mg
  Filled 2019-12-25: qty 2.5

## 2019-12-25 MED ORDER — ORAL CARE MOUTH RINSE: 15.0000 mL | OROMUCOSAL | Status: DC

## 2019-12-25 MED ORDER — SODIUM CHLORIDE 3 % IN NEBU
4.0000 mL | INHALATION_SOLUTION | Freq: Two times a day (BID) | RESPIRATORY_TRACT | Status: DC
  Administered 2019-12-25 (×2): 4 mL via RESPIRATORY_TRACT
  Filled 2019-12-25 (×2): qty 4

## 2019-12-25 MED ORDER — VITAL AF 1.2 CAL PO LIQD
1000.0000 mL | ORAL | Status: DC
  Administered 2019-12-25 – 2019-12-26 (×2): 1000 mL

## 2019-12-25 MED ORDER — SODIUM CHLORIDE 0.9 % IV SOLN
3.0000 g | Freq: Four times a day (QID) | INTRAVENOUS | Status: DC
  Administered 2019-12-25 – 2019-12-26 (×5): 3 g via INTRAVENOUS
  Filled 2019-12-25 (×5): qty 8

## 2019-12-25 MED ORDER — SODIUM CHLORIDE 0.9 % IV SOLN
INTRAVENOUS | Status: DC | PRN
  Administered 2019-12-25: 250 mL via INTRAVENOUS

## 2019-12-25 MED ORDER — CHLORHEXIDINE GLUCONATE CLOTH 2 % EX PADS
6.0000 | MEDICATED_PAD | Freq: Every day | CUTANEOUS | Status: DC
  Administered 2019-12-26 (×2): 6 via TOPICAL

## 2019-12-25 NOTE — Progress Notes (Signed)
NAME:  Samson Ralph, MRN:  [id number], DOB:  Apr 17, 1990, LOS: 1 ADMISSION DATE:  12/24/2019, CONSULTATION DATE: December 24, 2019 REFERRING MD: Dr. Renaye Rakers, CHIEF COMPLAINT:  Found down  Brief History   29 year old male brought in by EMS after being found down, was reported to have been drinking heavily throughout the day. Intubated in the emergency room.3  History of present illness   29 year old male with no known past medical history was brought in by EMS after being found down in the setting of heavy alcohol use on December 24, 2019. In the emergency room the patient was noted to be profoundly hypoxemic and obtunded requiring intubation. After intubation he self extubated but remained obtunded and profoundly hypoxemic. He was promptly reintubated. Pulmonary and critical care medicine is consulted for further evaluation. There are no family present to give history, the only reported history was that he had been drinking heavily all day. The patient was intubated on my evaluation and cannot provide history.  Past Medical History  Unknown  Significant Hospital Events   10/30 Admit  Consults:    Procedures:  ETT 10/30 >>   Significant Diagnostic Tests:   CT head 10/30 >> unremarkable  CT 10/30 >>  debris in the bronchus intermedius, mucus plugging and consolidation in the right lower lobe  Micro Data:  COVID 10/30 >> negative Blood 10/31 >>   Antimicrobials:  Rocephin 10/30 >> 10/31 Zithromax 10/30 >> 10/31 Unasyn 10/31 >>  Interim history/subjective:  Remains on sedation, vent.  Objective   Blood pressure 119/77, pulse 88, temperature 99.8 F (37.7 C), temperature source Oral, resp. rate 18, height 6\' 3"  (1.905 m), weight 99.7 kg, SpO2 100 %.    Vent Mode: PRVC FiO2 (%):  [40 %-100 %] 40 % Set Rate:  [15 bmp-18 bmp] 18 bmp Vt Set:  [500 mL-670 mL] 670 mL PEEP:  [5 cmH20-12 cmH20] 10 cmH20   Intake/Output Summary (Last 24 hours) at 12/25/2019 1013 Last data  filed at 12/25/2019 12/27/2019 Gross per 24 hour  Intake 1593.91 ml  Output 865 ml  Net 728.91 ml   Filed Weights   12/24/19 2151 12/25/19 0152  Weight: 107.8 kg 99.7 kg    Examination:  General - sedated Eyes - pupils reactive ENT - ETT in place Cardiac - regular rate/rhythm, no murmur Chest - b/l rales Rt > Lt Abdomen - soft, non tender, + bowel sounds Extremities - no cyanosis, clubbing, or edema Skin - no rashes Neuro - RASS -1, moves all extremities  Resolved Hospital Problem list   Mucus plugging of airway  Assessment & Plan:   Acute hypoxic respiratory failure 2nd to aspiration pneumonia and compromised airway. - full vent support - goal SpO2 > 92% - change ABx to unasyn - f/u CXR  Acute metabolic encephalopathy 2nd to hypoxia, pneumonia, and alcohol intoxication. - RASS goal -1 to -2  Best practice:  Diet: Tube feeding DVT prophylaxis: Lovenox GI prophylaxis: Famotidine Mobility: Bedrest Code Status: Full Disposition: ICU  Labs    CMP Latest Ref Rng & Units 12/25/2019 12/25/2019 12/25/2019  Glucose 70 - 99 mg/dL - 12/27/2019) -  BUN 6 - 20 mg/dL - 10 -  Creatinine 341(P - 1.24 mg/dL - 3.79 -  Sodium 0.24 - 145 mmol/L 141 140 142  Potassium 3.5 - 5.1 mmol/L 4.3 5.3(H) 4.7  Chloride 98 - 111 mmol/L - 104 -  CO2 22 - 32 mmol/L - 25 -  Calcium 8.9 - 10.3 mg/dL - 8.9 -  Total Protein 6.5 - 8.1 g/dL - 7.8 -  Total Bilirubin 0.3 - 1.2 mg/dL - 1.5(H) -  Alkaline Phos 38 - 126 U/L - 69 -  AST 15 - 41 U/L - 28 -  ALT 0 - 44 U/L - 21 -    CBC Latest Ref Rng & Units 12/25/2019 12/25/2019 12/25/2019  WBC 4.0 - 10.5 K/uL - 7.5 -  Hemoglobin 13.0 - 17.0 g/dL 17.7(H) 17.2(H) 16.3  Hematocrit 39 - 52 % 52.0 55.4(H) 48.0  Platelets 150 - 400 K/uL - 281 -   ABG    Component Value Date/Time   PHART 7.348 (L) 12/25/2019 0422   PCO2ART 45.2 12/25/2019 0422   PO2ART 402 (H) 12/25/2019 0422   HCO3 24.8 12/25/2019 0422   TCO2 26 12/25/2019 0422   ACIDBASEDEF 1.0  12/25/2019 0422   O2SAT 100.0 12/25/2019 0422    CBG (last 3)  Recent Labs    12/25/19 0156 12/25/19 0438 12/25/19 0817  GLUCAP 105* 98 111*    Critical care time: 34 minutes  Coralyn Helling, MD [place] [facility] Pager - [phone] 12/25/2019, 10:18 AM

## 2019-12-25 NOTE — Progress Notes (Signed)
[facility] PCCM  Read from the CT chest questions whether or not the OG tube is extraluminal.  OG has been removed. Repeat KUB, hold replacing OG for now  CXR shows R lung white out: most likely due to mucus plugging, will provide pulmonary toilette and consider bronchoscopy after arriving in the ICU  Heber Seminole, MD [place] [facility] Pager: [phone] Cell: [phone] If no response, call [phone]

## 2019-12-25 NOTE — Progress Notes (Addendum)
eLink Physician-Brief Progress Note Patient Name: Seth Burnett DOB: 15-Dec-1990 MRN: [id number]   Date of Service  12/25/2019  HPI/Events of Note  60 M brought in after being found down after heavy alcohol intake. Intubated in the ED as he was obtunded and hypoxic. He self extubated but was immediately re-intubated.  CXR as seen earlier with complete opacification of right hemithorax  On PRVC 15/670/100%/12 PEEP Peak pressure 30, plateau pressure 25  O2 sat 84%  eICU Interventions  Likely aspiration/mucus plug. Already on ceftriaxone and azithromycin Suction and mini lavage c/o RT done without significant secretions retrieved About to be given albuterol nebulization If above interventions fail, bedside CCM team to be informed for possible bronch lavage   O2 sat now improved to 94%   Intervention Category Major Interventions: Respiratory failure - evaluation and management Evaluation Type: New Patient Evaluation  Darl Pikes 12/25/2019, 1:39 AM

## 2019-12-25 NOTE — Progress Notes (Addendum)
Patient's mother, Morton Amy, was updated on patient's status and current plan of care. All questions were answered at this time.   Patient's mother mentioned that he is currently taking blood thinners for PE's. Dr. Arlester Marker (pharmacist) was made aware of this new information.  Patient sees Dr. Thornell Mule at [address] [facility] as his PCP. Phone number: [phone]   Unfortunately, roommate is currently out of town and unable to tell RN which pharmacy the patient uses.

## 2019-12-25 NOTE — Progress Notes (Addendum)
Pharmacy Antibiotic Note  Seth Burnett is a 29 y.o. male admitted on 12/24/2019 with pneumonia.  Pharmacy has been consulted for Unasyn dosing.  Pt without PMH who was admitted after heavy drinking. Ceftriaxone/azith were started but abx will be changed to Unasyn.   Scr 1.17 WBC 7.5  Plan: Unasyn 3g IV q6  Height: 6\' 3"  (190.5 cm) Weight: 99.7 kg (219 lb 12.8 oz) IBW/kg (Calculated) : 84.5  Temp (24hrs), Avg:98.7 F (37.1 C), Min:97.4 F (36.3 C), Max:100 F (37.8 C)  Recent Labs  Lab 12/24/19 2116 12/25/19 0340  WBC 6.7 7.5  CREATININE 0.98 1.17    Estimated Creatinine Clearance: 110.3 mL/min (by C-G formula based on SCr of 1.17 mg/dL).    No Known Allergies  Antimicrobials this admission: 10/31 ceftriaxone x1 10/31 azith x1 10/31 Unasyn>>  Dose adjustments this admission:   Microbiology results: 10/31 blood>> MRSA>>poor sample HIV neg  11/31, PharmD, BCIDP, AAHIVP, CPP Infectious Disease Pharmacist 12/25/2019 11:48 AM   Addendum:  Pt may be on anticoagulant for PE per mom. We will bridge with Lovenox for now until more info are found. We will check INR today.   Lovenox 150mg  SQ q24 F/u with medrec Check INR/HL  12/27/2019, PharmD, BCIDP, AAHIVP, CPP Infectious Disease Pharmacist 12/25/2019 1:34 PM

## 2019-12-25 NOTE — Progress Notes (Signed)
eLink Physician-Brief Progress Note Patient Name: Seth Burnett DOB: 02-28-1990 MRN: [id number]   Date of Service  12/25/2019  HPI/Events of Note  Notified of pneumothorax while in ED. On review of CXR complete opacity of right hemithorax with shift of trachea to the right. ETT appears to be in proper position. Chest CT done earlier with evidence of mucus plugging. No issues with ventilating as per report.  eICU Interventions  Recommend conservative measures, nebulization, suction and chest physiotherapy. Refer back to eLink if with issues ventilating and will inform bedside team as may need bronch lavage. Will reassess by video cam once patient transferred to unit     Intervention Category Major Interventions: Respiratory failure - evaluation and management  Darl Pikes 12/25/2019, 12:17 AM

## 2019-12-25 NOTE — Progress Notes (Signed)
Initial Nutrition Assessment  RD working remotely.  DOCUMENTATION CODES:   Not applicable  INTERVENTION:   -D/c Vital High Protein -Initiate Vital AF 1.2 @ 60 ml/hr via OGT (1440 ml/day)  45 ml Prosource TF BID.    Tube feeding regimen provides 1808 kcal (74% of needs), 130 grams of protein, and 1168 ml of H2O.   TF + propofol to provide 2496 kcals (100% of needs)  NUTRITION DIAGNOSIS:   Inadequate oral intake related to inability to eat as evidenced by NPO status.  GOAL:   Patient will meet greater than or equal to 90% of their needs  MONITOR:   Vent status, Weight trends, Labs, TF tolerance, Skin, I & O's  REASON FOR ASSESSMENT:   Consult Enteral/tube feeding initiation and management  ASSESSMENT:   28 year old male brought in by EMS after being found down, was reported to have been drinking heavily throughout the day. Intubated in the emergency room  Pt admitted with acute respiratory failure, aspiration pneumonia, and acute metabolic encephalopathy likely related to alcohol intoxication.   Patient is currently intubated on ventilator support. OGT in place. MV: 11.8 L/min Temp (24hrs), Avg:98.7 F (37.1 C), Min:97.4 F (36.3 C), Max:100 F (37.8 C)  Propofol: 25.9 ml/hr (684 kcals daily)  Reviewed I/O's: +834 ml x 24 hours  UOP: 675 ml x 24 hours  TF currently infusing via OGT: Vital High Protein @ 40 ml/hr and 45 ml Prosource TF BID. Regimen provides 1040 kcals, 106 grams protein, and 803 ml free water, meeting 43% of estimated kcal needs and 88% of estimated protein needs.   Medications reviewed and include miralax.   Labs reviewed: CBGS: 98-111.   Diet Order:   Diet Order            Diet NPO time specified  Diet effective now                 EDUCATION NEEDS:   No education needs have been identified at this time  Skin:  Skin Assessment: Reviewed RN Assessment  Last BM:  Unknown  Height:   Ht Readings from Last 1 Encounters:   12/25/19 6\' 3"  (1.905 m)    Weight:   Wt Readings from Last 1 Encounters:  12/25/19 99.7 kg    Ideal Body Weight:  89.1 kg  BMI:  Body mass index is 27.47 kg/m.  Estimated Nutritional Needs:   Kcal:  2425  Protein:  120-140 grams  Fluid:  > 2 L    2426, RD, LDN, CDCES Registered Dietitian II Certified Diabetes Care and Education Specialist Please refer to [facility] for RD and/or RD on-call/weekend/after hours pager

## 2019-12-26 ENCOUNTER — Inpatient Hospital Stay (HOSPITAL_COMMUNITY): Payer: Self-pay

## 2019-12-26 DIAGNOSIS — G9341 Metabolic encephalopathy: Secondary | ICD-10-CM

## 2019-12-26 DIAGNOSIS — J690 Pneumonitis due to inhalation of food and vomit: Secondary | ICD-10-CM

## 2019-12-26 DIAGNOSIS — R4182 Altered mental status, unspecified: Secondary | ICD-10-CM

## 2019-12-26 DIAGNOSIS — F10929 Alcohol use, unspecified with intoxication, unspecified: Secondary | ICD-10-CM

## 2019-12-26 LAB — CBC
HCT: 43.4 % (ref 39.0–52.0)
Hemoglobin: 14.1 g/dL (ref 13.0–17.0)
MCH: 27.4 pg (ref 26.0–34.0)
MCHC: 32.5 g/dL (ref 30.0–36.0)
MCV: 84.3 fL (ref 80.0–100.0)
Platelets: 245 10*3/uL (ref 150–400)
RBC: 5.15 MIL/uL (ref 4.22–5.81)
RDW: 13.3 % (ref 11.5–15.5)
WBC: 11.8 10*3/uL — ABNORMAL HIGH (ref 4.0–10.5)
nRBC: 0 % (ref 0.0–0.2)

## 2019-12-26 LAB — BASIC METABOLIC PANEL
Anion gap: 11 (ref 5–15)
BUN: 15 mg/dL (ref 6–20)
CO2: 23 mmol/L (ref 22–32)
Calcium: 8.8 mg/dL — ABNORMAL LOW (ref 8.9–10.3)
Chloride: 105 mmol/L (ref 98–111)
Creatinine, Ser: 1.19 mg/dL (ref 0.61–1.24)
GFR, Estimated: >60 mL/min (ref >60)
Glucose, Bld: 103 mg/dL — ABNORMAL HIGH (ref 70–99)
Potassium: 3.6 mmol/L (ref 3.5–5.1)
Sodium: 139 mmol/L (ref 135–145)

## 2019-12-26 LAB — GLUCOSE, CAPILLARY
Glucose-Capillary: 102 mg/dL — ABNORMAL HIGH (ref 70–99)
Glucose-Capillary: 109 mg/dL — ABNORMAL HIGH (ref 70–99)
Glucose-Capillary: 110 mg/dL — ABNORMAL HIGH (ref 70–99)
Glucose-Capillary: 114 mg/dL — ABNORMAL HIGH (ref 70–99)
Glucose-Capillary: 71 mg/dL (ref 70–99)
Glucose-Capillary: 87 mg/dL (ref 70–99)
Glucose-Capillary: 99 mg/dL (ref 70–99)

## 2019-12-26 LAB — MAGNESIUM: Magnesium: 2 mg/dL (ref 1.7–2.4)

## 2019-12-26 LAB — PHOSPHORUS: Phosphorus: 2 mg/dL — ABNORMAL LOW (ref 2.5–4.6)

## 2019-12-26 LAB — TRIGLYCERIDES: Triglycerides: 166 mg/dL — ABNORMAL HIGH (ref <150)

## 2019-12-26 MED ORDER — APIXABAN 5 MG PO TABS
5.0000 mg | ORAL_TABLET | Freq: Two times a day (BID) | ORAL | Status: DC
  Administered 2019-12-27 (×2): 5 mg via ORAL
  Filled 2019-12-26 (×2): qty 1

## 2019-12-26 MED ORDER — AMOXICILLIN-POT CLAVULANATE 875-125 MG PO TABS
1.0000 | ORAL_TABLET | Freq: Two times a day (BID) | ORAL | Status: DC
  Administered 2019-12-26 – 2019-12-27 (×2): 1 via ORAL
  Filled 2019-12-26 (×2): qty 1

## 2019-12-26 NOTE — Progress Notes (Signed)
Transitions of Care Pharmacist Note  Seth Burnett is a 30 y.o. male that has been diagnosed with previous unprovoked PE and will be continued on Eliquis (apixaban) at discharge.   Patient Education: I provided the following education on Eliquis to the patient: How to take the medication Described what the medication is Signs of bleeding Signs/symptoms of VTE and stroke  Answered their questions  Discharge Medications Plan: The patient wants to have their discharge medications filled by the Transitions of Care pharmacy rather than their usual pharmacy.  The discharge orders pharmacy has been changed to the Transitions of Care pharmacy, the patient will receive a phone call regarding co-pay, and their medications will be delivered by the Transitions of Care pharmacy.    Thank you,   Lamar Sprinkles, PharmD PGY1 Pharmacy Resident 12/26/2019 5:42 PM  December 26, 2019

## 2019-12-26 NOTE — Procedures (Signed)
Extubation Procedure Note  Patient Details:   Name: Claire Bridge DOB: 20-Sep-1990 MRN: [id number]   Airway Documentation:    Vent end date: 12/26/19 Vent end time: 0918   Evaluation  O2 sats: stable throughout Complications: No apparent complications Patient did tolerate procedure well. Bilateral Breath Sounds: Clear   Yes   Positive cuff leak noted. Patient placed on [place] 4 L with humidity, no stridor noted, patient able to reach 2500 mL using the incentive spirometer.  Rayburn Felt 12/26/2019, 9:26 AM

## 2019-12-26 NOTE — Progress Notes (Addendum)
NAME:  Seth Burnett, MRN:  [id number], DOB:  Feb 23, 1991, LOS: 2 ADMISSION DATE:  12/24/2019, CONSULTATION DATE: December 24, 2019 REFERRING MD: Dr. Renaye Rakers, CHIEF COMPLAINT:  Found down  Brief History   29 year old male brought in by EMS after being found down, was reported to have been drinking heavily throughout the day. Intubated in the emergency room.3  History of present illness   29 year old male with no known past medical history was brought in by EMS after being found down in the setting of heavy alcohol use on December 24, 2019. In the emergency room the patient was noted to be profoundly hypoxemic and obtunded requiring intubation. After intubation he self extubated but remained obtunded and profoundly hypoxemic. He was promptly reintubated. Pulmonary and critical care medicine is consulted for further evaluation. There are no family present to give history, the only reported history was that he had been drinking heavily all day. The patient was intubated on my evaluation and cannot provide history.  Past Medical History  Unknown  Significant Hospital Events   10/30 Admit  Consults:    Procedures:  ETT 10/30 >>   Significant Diagnostic Tests:   CT head 10/30 >> unremarkable  CT 10/30 >>  debris in the bronchus intermedius, mucus plugging and consolidation in the right lower lobe  Micro Data:  COVID 10/30 >> negative Blood 10/31 >>   Antimicrobials:  Rocephin 10/30 >> 10/31 Zithromax 10/30 >> 10/31 Unasyn 10/31 >>  Interim history/subjective:  More awake today. Tolerating SBT well.   Objective   Blood pressure 109/68, pulse 74, temperature 98.2 F (36.8 C), temperature source Axillary, resp. rate 18, height 6\' 3"  (1.905 m), weight 100.8 kg, SpO2 100 %.    Vent Mode: PRVC FiO2 (%):  [40 %] 40 % Set Rate:  [18 bmp] 18 bmp Vt Set:  [670 mL] 670 mL PEEP:  [8 cmH20] 8 cmH20 Plateau Pressure:  [19 cmH20] 19 cmH20   Intake/Output Summary (Last 24 hours)  at 12/26/2019 0903 Last data filed at 12/26/2019 0600 Gross per 24 hour  Intake 2819.32 ml  Output 1070 ml  Net 1749.32 ml   Filed Weights   12/24/19 2151 12/25/19 0152 12/26/19 0530  Weight: 107.8 kg 99.7 kg 100.8 kg    Examination: General:  Young adult male of normal body habitus in NAD on SBT Neuro:  Alert, oriented, non-focal. Following commands.  HEENT:  [place]/AT, No JVD noted, PERRL Cardiovascular:  RRR, no MRG Lungs:  Clear bilateral breath sounds. Pulling volumes over 1L on SBT 5/5.  Abdomen:  Soft, non-distended, non-tender.  Musculoskeletal:  No acute deformity or ROM limitation.  Skin:  Intact, MMM  Resolved Hospital Problem list   Mucus plugging of airway  Assessment & Plan:   Acute hypoxic respiratory failure secondary to aspiration pneumonia and compromised airway in the setting of ETOH intoxication. CXR improved.  - extubate - Continue unasyn for 5 day course > 11/3. - supplemental O2 PRN  Acute metabolic encephalopathy 2nd to hypoxia, pneumonia, and alcohol intoxication. - sedatives off for extubation - RASS goal 0  PE history: on home eliquis - restart home Eliquis for this evening, I expect him to be taking PO without difficulty by then.  - Transition off therapuetic dose lovenox.  Hypertriglyceridemia mild 166 - DC propofol  If he tolerates extubation we can plan to move out of ICU this evening and possibly discharge as soon as tomorrow.   Best practice:  Diet: Tube feeding DVT prophylaxis: Lovenox GI  prophylaxis: Famotidine Mobility: Bedrest Code Status: Full Disposition: ICU  Labs    CMP Latest Ref Rng & Units 12/26/2019 12/25/2019 12/25/2019  Glucose 70 - 99 mg/dL 629(B) - 284(X)  BUN 6 - 20 mg/dL 15 - 10  Creatinine 3.24 - 1.24 mg/dL 4.01 - 0.27  Sodium 253 - 145 mmol/L 139 141 140  Potassium 3.5 - 5.1 mmol/L 3.6 4.3 5.3(H)  Chloride 98 - 111 mmol/L 105 - 104  CO2 22 - 32 mmol/L 23 - 25  Calcium 8.9 - 10.3 mg/dL 6.6(Y) - 8.9  Total  Protein 6.5 - 8.1 g/dL - - 7.8  Total Bilirubin 0.3 - 1.2 mg/dL - - 1.5(H)  Alkaline Phos 38 - 126 U/L - - 69  AST 15 - 41 U/L - - 28  ALT 0 - 44 U/L - - 21    CBC Latest Ref Rng & Units 12/26/2019 12/25/2019 12/25/2019  WBC 4.0 - 10.5 K/uL 11.8(H) - 7.5  Hemoglobin 13.0 - 17.0 g/dL 40.3 17.7(H) 17.2(H)  Hematocrit 39 - 52 % 43.4 52.0 55.4(H)  Platelets 150 - 400 K/uL 245 - 281   ABG    Component Value Date/Time   PHART 7.348 (L) 12/25/2019 0422   PCO2ART 45.2 12/25/2019 0422   PO2ART 402 (H) 12/25/2019 0422   HCO3 24.8 12/25/2019 0422   TCO2 26 12/25/2019 0422   ACIDBASEDEF 1.0 12/25/2019 0422   O2SAT 100.0 12/25/2019 0422    CBG (last 3)  Recent Labs    12/26/19 0018 12/26/19 0419 12/26/19 0816  GLUCAP 110* 102* 71    Critical care time: 32 minutes    Joneen Roach, AGACNP-BC [place] [facility]  See Amion for personal pager PCCM on call pager [phone]  12/26/2019 9:17 AM

## 2019-12-26 NOTE — Discharge Instructions (Signed)

## 2019-12-27 ENCOUNTER — Other Ambulatory Visit (HOSPITAL_COMMUNITY): Payer: Self-pay | Admitting: Internal Medicine

## 2019-12-27 DIAGNOSIS — R410 Disorientation, unspecified: Secondary | ICD-10-CM

## 2019-12-27 LAB — GLUCOSE, CAPILLARY
Glucose-Capillary: 87 mg/dL (ref 70–99)
Glucose-Capillary: 90 mg/dL (ref 70–99)

## 2019-12-27 MED ORDER — ONDANSETRON 4 MG PO TBDP: 4.0000 mg | ORAL_TABLET | Freq: Three times a day (TID) | ORAL | 0 refills | Status: DC | PRN

## 2019-12-27 MED ORDER — AMOXICILLIN-POT CLAVULANATE 875-125 MG PO TABS: 1.0000 | ORAL_TABLET | Freq: Two times a day (BID) | ORAL | 0 refills | Status: DC

## 2019-12-27 MED ORDER — ACETAMINOPHEN 325 MG PO TABS
650.0000 mg | ORAL_TABLET | Freq: Once | ORAL | Status: AC
  Administered 2019-12-27: 650 mg via ORAL
  Filled 2019-12-27: qty 2

## 2019-12-27 MED ORDER — APIXABAN 5 MG PO TABS
5.0000 mg | ORAL_TABLET | Freq: Two times a day (BID) | ORAL | 0 refills | Status: DC
Start: 2019-12-27 — End: 2019-12-27

## 2019-12-27 MED FILL — AMOX-CLAV 875-125 MG TABLET: 875-125 | 2 days supply | Qty: 5 | Fill #0

## 2019-12-27 MED FILL — ONDANSETRON ODT 4 MG TABLET: 4 | 3 days supply | Qty: 10 | Fill #0

## 2019-12-27 MED FILL — ELIQUIS 5 MG TABLET: 5 | 30 days supply | Qty: 60 | Fill #0

## 2019-12-27 NOTE — Discharge Summary (Signed)
Physician Discharge Summary       Patient ID: Seth Burnett MRN: [id number] DOB/AGE: 1990/05/03 29 y.o.  Admit date: 12/24/2019 Discharge date: 12/27/2019  Discharge Diagnoses:  Active Problems:   Acute alcoholic intoxication with complication (HCC)   Altered mental status   Aspiration pneumonia of both lower lobes due to vomit [facility])   History of Present Illness:  29 year old male with past medical history of DVT/PE on Eliquis was brought in by EMS after being found down in the setting of heavy alcohol use on December 24, 2019. In the emergency room the patient was noted to be profoundly hypoxemic and obtunded requiring intubation. After intubation he self extubated but remained obtunded and profoundly hypoxemic. He was promptly reintubated. Pulmonary and critical care medicine is consulted for further evaluation.   Hospital Course:   He was admitted to the ICU service on 10/30 on mechanical ventilation being treated for alcohol intoxication with supportive care and aspiration pneumonia with vest support and unasyn IV. He was continued on anticoagulation with lovenox. On 11/1 he was much more awake and tolerating minimal vent settings. He passed SBT and was extubated. He was transitioned to oral therapies including Augmentin, Eliquis. Complained of nausea/vomiting treated with zofran effectively.  11/2 He has tolerated extubation well with sats in the high 90s on room air. He is deemed a candidate for discharge to home.    Discharge Plan by active problems   Aspiration pneumonia - Continue Augmentin through 11/4 for a 5 day total course - Advance activity as tolerated, no restrictions - Follow up with PCP if any ongoing issues including fevers, dyspnea, productive cough.   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  - Continue Eliquis - Hematology follow up as scheduled.       Significant Hospital tests/ studies   CT head 10/30 >> unremarkable CT chest 10/30 >>  debris in the bronchus intermedius, mucus plugging and consolidation in the right lower lobe  Consults   Discharge Exam: BP 124/69   Pulse 85   Temp 98.4 F (36.9 C) (Oral)   Resp 16   Ht 6\' 3"  (1.905 m)   Wt 96.6 kg   SpO2 95%   BMI 26.62 kg/m   General:  Young adult male in bed in no acute distress Neuro:  Alert, oriented, non-focal HEENT:  [place]/AT, No JVD noted, PERRL Cardiovascular:  RRR, no MRG Lungs:  Clear bilateral breath sounds Abdomen:  Soft, non-distended, non-tender Musculoskeletal:  No acute deformity or range of motion limitation Skin:  Intact, MMM    Labs at discharge Lab Results  Component Value Date   CREATININE 1.19 12/26/2019   BUN 15 12/26/2019   NA 139 12/26/2019   K 3.6 12/26/2019   CL 105 12/26/2019   CO2 23 12/26/2019   Lab Results  Component Value Date   WBC 11.8 (H) 12/26/2019   HGB 14.1 12/26/2019   HCT 43.4 12/26/2019   MCV 84.3 12/26/2019   PLT 245 12/26/2019   Lab Results  Component Value Date   ALT 21 12/25/2019   AST 28 12/25/2019   ALKPHOS 69 12/25/2019   BILITOT 1.5 (H) 12/25/2019   Lab Results  Component Value Date   INR 1.1 12/25/2019    Current radiology studies DG Chest Port 1 View  Result Date: 12/26/2019 CLINICAL DATA:  Respiratory failure EXAM: PORTABLE CHEST 1 VIEW COMPARISON:  12/25/2019 FINDINGS: ETT tip is stable  above the carina. There is a nasogastric tube with side port below the GE junction. Heart size is normal. No pleural effusion or edema. Asymmetric airspace opacities within the right upper, mid and lower lung zones have improved in the interval. No new findings. IMPRESSION: 1. Improving aeration to the right upper, mid and lower lung zones. 2. Stable support apparatus Electronically Signed   By: Signa Kell M.D.   On: 12/26/2019 07:32    Disposition:  Discharge disposition: 01-Home or Self  Care       Discharge Instructions    Activity as tolerated - No restrictions   Complete by: As directed    Call MD for:  difficulty breathing, headache or visual disturbances   Complete by: As directed    Call MD for:  hives   Complete by: As directed    Call MD for:  persistant nausea and vomiting   Complete by: As directed    Diet general   Complete by: As directed      Allergies as of 12/27/2019   No Known Allergies     Medication List    TAKE these medications   amoxicillin-clavulanate 875-125 MG tablet Commonly known as: AUGMENTIN Take 1 tablet by mouth every 12 (twelve) hours for 5 doses.   apixaban 5 MG Tabs tablet Commonly known as: ELIQUIS Take 1 tablet (5 mg total) by mouth 2 (two) times daily.   ondansetron 4 MG disintegrating tablet Commonly known as: Zofran ODT Take 1 tablet (4 mg total) by mouth every 8 (eight) hours as needed for nausea or vomiting.        Discharged Condition: good  38 minutes of time have been dedicated to discharge assessment, planning and discharge instructions.   Signed:  Joneen Roach, AGACNP-BC [place] [facility]  See Amion for personal pager PCCM on call pager [phone]  12/27/2019 9:24 AM

## 2019-12-27 NOTE — TOC Transition Note (Signed)
Transition of Care [facility]) - CM/SW Discharge Note   Patient Details  Name: Seth Burnett MRN: [id number] Date of Birth: 11-Jun-1990  Transition of Care [facility]) CM/SW Contact:  Lawerance Sabal, RN Phone Number: 12/27/2019, 10:57 AM   Clinical Narrative:   Sherron Monday w patient at bedside. He states that he gets his Eliquis through Dr Thornell Mule at [facility] in [place] (where he lives), amd the Eliquis patient assistance program. He has 1.5 months supply at home. He is to call Dr Thornell Mule for follow up. He has a PCP (recently got new phone, was trying to log in to his mychart w [facility] but didn't have log in info). MATCH letter in, meds sent to [facility] pharmacy.     Final next level of care: Home/Self Care Barriers to Discharge: No Barriers Identified   Patient Goals and CMS Choice        Discharge Placement                       Discharge Plan and Services                                     Social Determinants of Health (SDOH) Interventions     Readmission Risk Interventions No flowsheet data found.

## 2019-12-30 LAB — CULTURE, BLOOD (ROUTINE X 2)
Culture: NO GROWTH
Culture: NO GROWTH
Special Requests: ADEQUATE

## 2021-08-15 IMAGING — CT CT CHEST W/O CM
2 of 4 series · 13 of 36 positions shown, 16 images · non-contrast
Comparison: None.
COMPARISON: None.

Addendum:
CLINICAL DATA: Altered mental status, found down.

EXAM:
CT CHEST WITHOUT CONTRAST
TECHNIQUE: Multidetector CT imaging of the chest was performed following the
standard protocol without IV contrast.

[Series 3: chest wo · axial · 0.67mm/px · z∈[-508,-248]mm · 10 of 154 slices shown, 13 images]
[im 12/154  mediastinal]
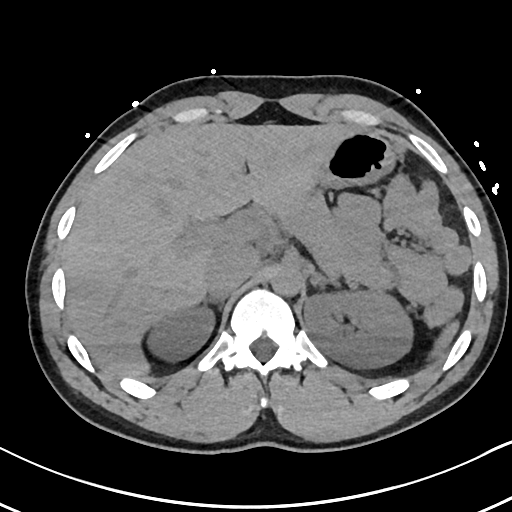
[im 12/154  lung]
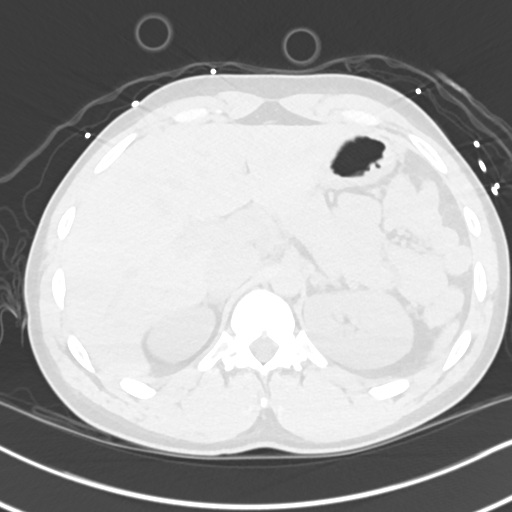
[im 24/154  lung]
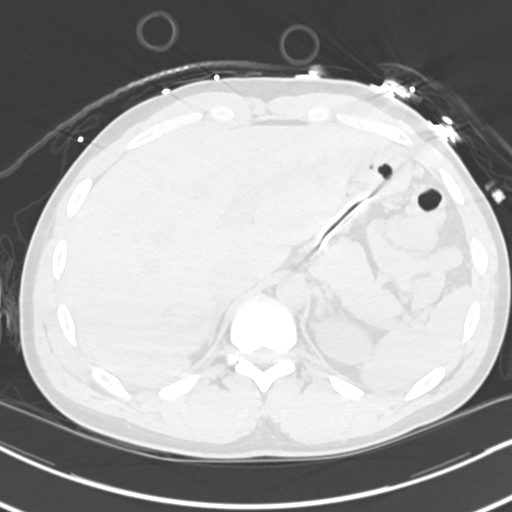
[im 48/154  lung]
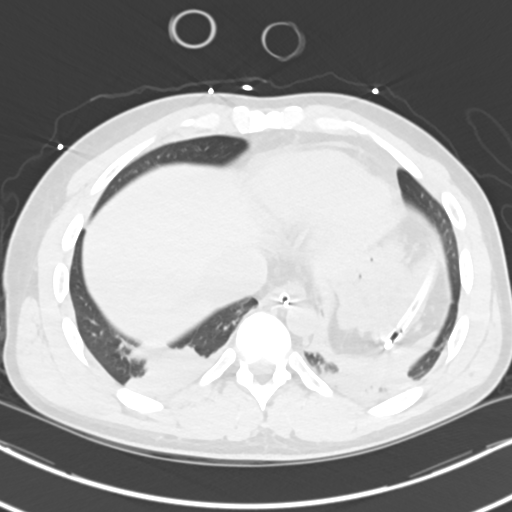
[im 59/154  lung]
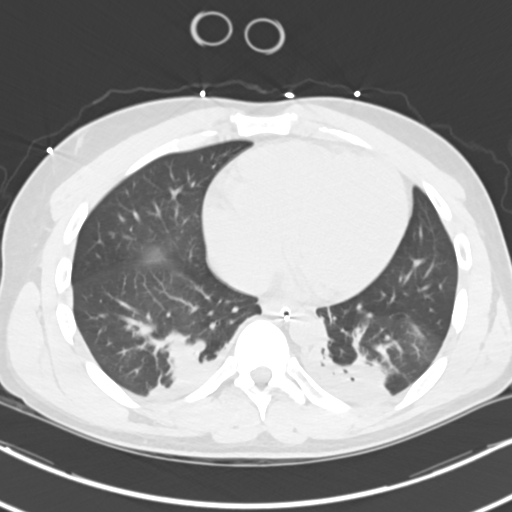
[im 71/154  mediastinal]
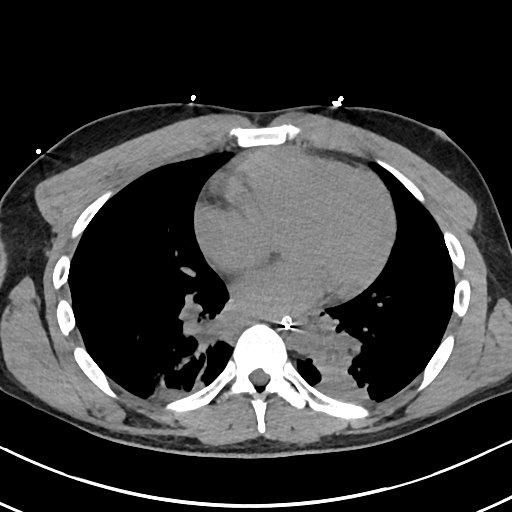
[im 71/154  lung]
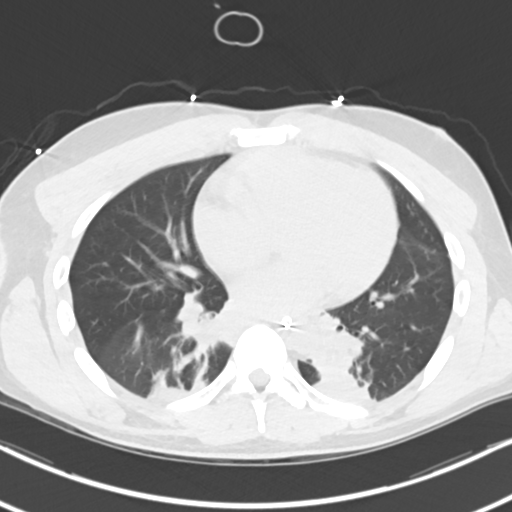
[im 83/154  lung]
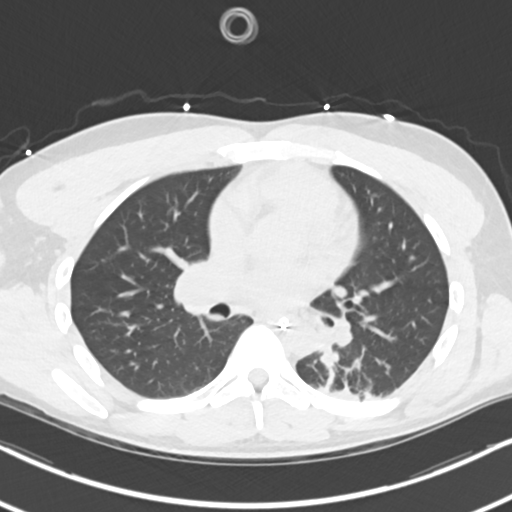
[im 95/154  lung]
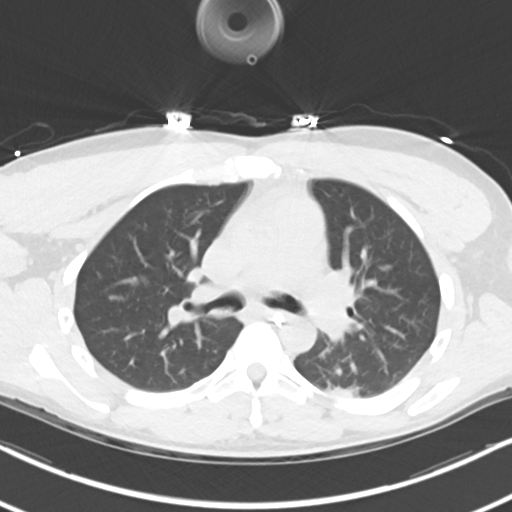
[im 118/154  lung]
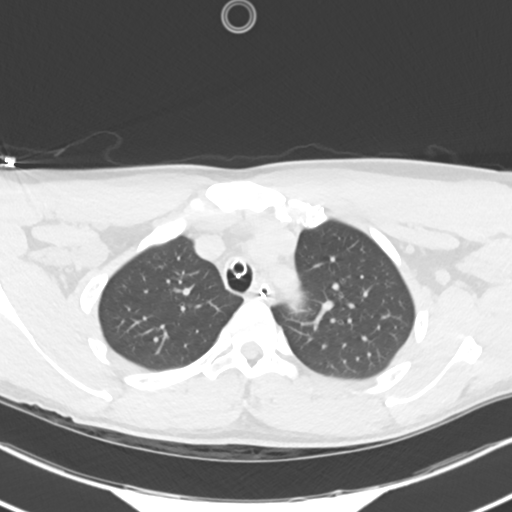
[im 130/154  mediastinal]
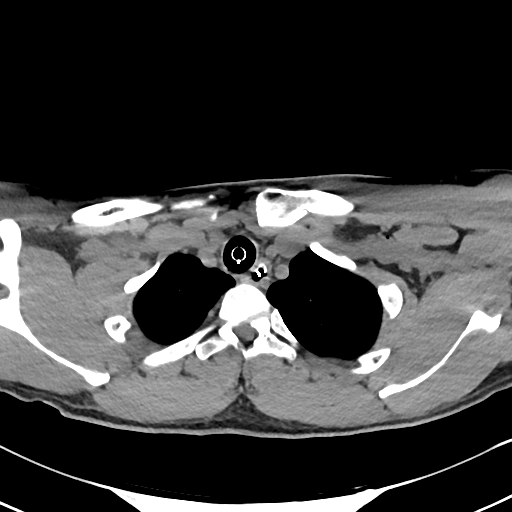
[im 130/154  lung]
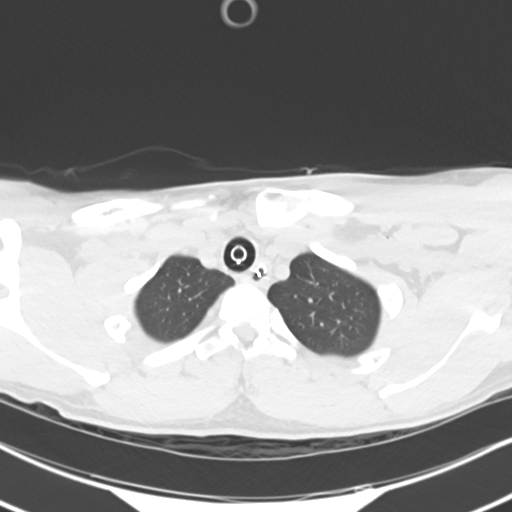
[im 142/154  lung]
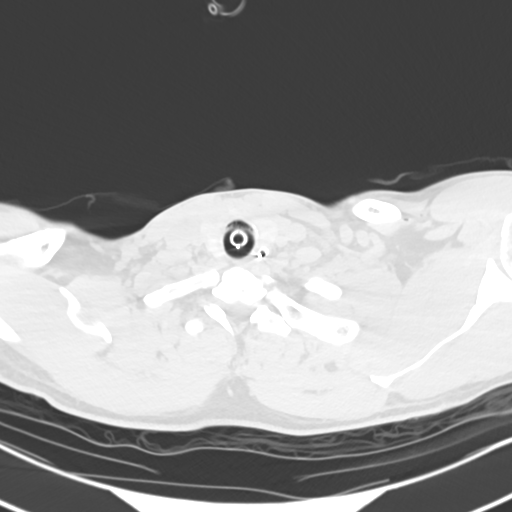

[Series 6: cor · coronal · 0.63mm/px · 3 of 156 slices shown]
[im 32/156  lung]
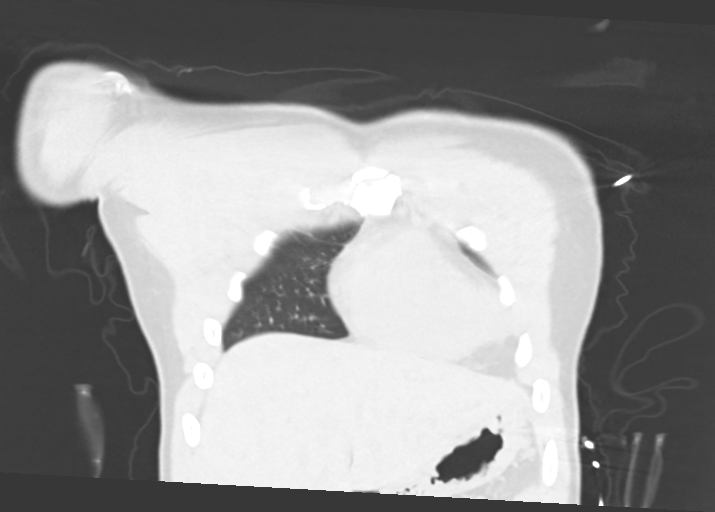
[im 63/156  lung]
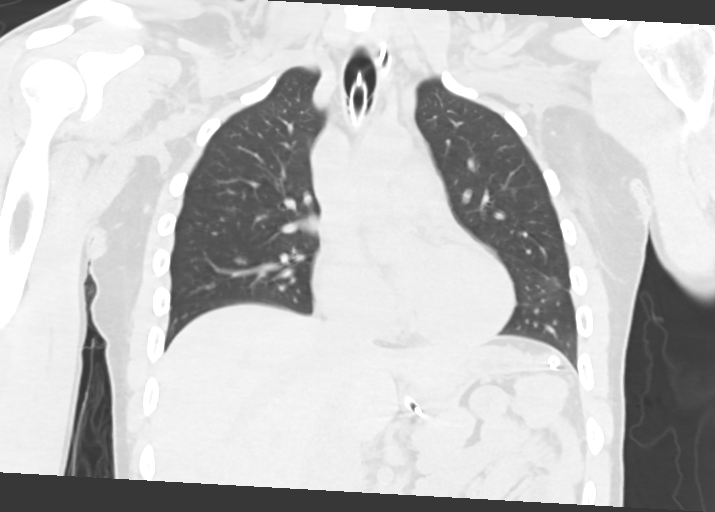
[im 94/156  lung]
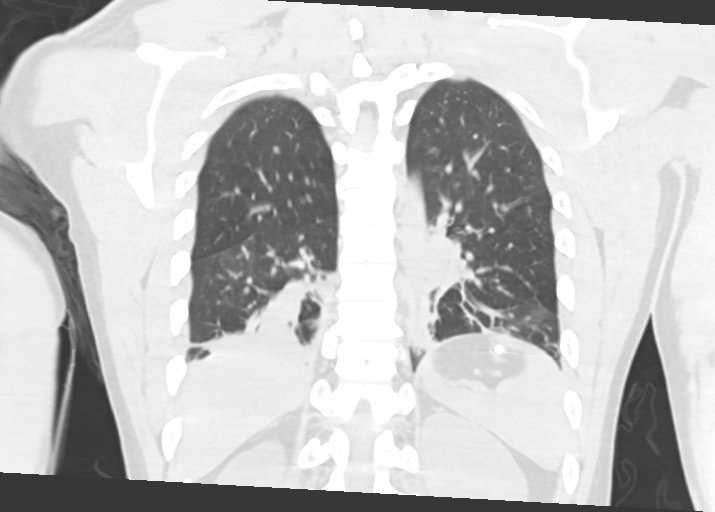

[13 of 36 positions shown; findings below may reference images not displayed]

FINDINGS: Cardiovascular: No significant vascular findings. Normal heart size.
A trace amount of pericardial fluid is seen.

Mediastinum/Nodes: There is mild left-sided supraclavicular
lymphadenopathy. No enlarged mediastinal or axillary lymph nodes.
Thyroid gland, trachea, and esophagus demonstrate no significant
findings.

Lungs/Pleura: Endotracheal and nasogastric tubes are seen (see
below).

Moderate severity consolidation is seen within the posterior aspect
of the bilateral lower lobes.

There is no evidence of a pleural effusion or pneumothorax.

Upper Abdomen: A nasogastric tube seen with its distal portion
extending along the anterior and lateral aspects of the left upper
quadrant. This is adjacent to the body of the stomach the, however
this appears to extend outside of the gastric lumen. The distal tip
of the nasogastric tube is seen within the posterior aspect of the
left upper quadrant.

It should be noted that free air is not clearly identified within
the left upper quadrant.

Musculoskeletal: No chest wall mass or suspicious bone lesions
identified.
IMPRESSION: 1. Moderate severity bilateral lower lobe consolidation.
2. Mild left-sided supraclavicular lymphadenopathy.
3. Nasogastric tube positioning as described above. The distal
portion of the NG tube may be within the lumen of a decompressed
stomach, however, given its appearance on the current exam,
extraluminal location cannot be excluded. Further evaluation with an
abdominal plain film, following injection of water-soluble
radiopaque contrast into the nasogastric tube lumen, is recommended.

ADDENDUM:
Results were discussed with Dr. Nyssa at [DATE] p.m. Eastern on
December 24, 2019

*** End of Addendum ***
FINDINGS: Cardiovascular: No significant vascular findings. Normal heart size.
A trace amount of pericardial fluid is seen.

Mediastinum/Nodes: There is mild left-sided supraclavicular
lymphadenopathy. No enlarged mediastinal or axillary lymph nodes.
Thyroid gland, trachea, and esophagus demonstrate no significant
findings.

Lungs/Pleura: Endotracheal and nasogastric tubes are seen (see
below).

Moderate severity consolidation is seen within the posterior aspect
of the bilateral lower lobes.

There is no evidence of a pleural effusion or pneumothorax.

Upper Abdomen: A nasogastric tube seen with its distal portion
extending along the anterior and lateral aspects of the left upper
quadrant. This is adjacent to the body of the stomach the, however
this appears to extend outside of the gastric lumen. The distal tip
of the nasogastric tube is seen within the posterior aspect of the
left upper quadrant.

It should be noted that free air is not clearly identified within
the left upper quadrant.

Musculoskeletal: No chest wall mass or suspicious bone lesions
identified.
IMPRESSION: 1. Moderate severity bilateral lower lobe consolidation.
2. Mild left-sided supraclavicular lymphadenopathy.
3. Nasogastric tube positioning as described above. The distal
portion of the NG tube may be within the lumen of a decompressed
stomach, however, given its appearance on the current exam,
extraluminal location cannot be excluded. Further evaluation with an
abdominal plain film, following injection of water-soluble
radiopaque contrast into the nasogastric tube lumen, is recommended.
